# Patient Record
Sex: Male | Born: 1970 | Race: Black or African American | Hispanic: No | Marital: Single | Smoking: Former smoker
Health system: Southern US, Community
[De-identification: ages and names within clinical notes are randomized; demographics above are authoritative.]

## PROBLEM LIST (undated history)

## (undated) HISTORY — PX: HAND SURGERY: SHX662

---

## 1998-10-04 ENCOUNTER — Emergency Department (HOSPITAL_COMMUNITY): Admission: EM | Admit: 1998-10-04 | Discharge: 1998-10-04 | Payer: Self-pay | Admitting: Emergency Medicine

## 1998-10-26 ENCOUNTER — Emergency Department (HOSPITAL_COMMUNITY): Admission: EM | Admit: 1998-10-26 | Discharge: 1998-10-26 | Payer: Self-pay | Admitting: Emergency Medicine

## 2001-03-09 ENCOUNTER — Emergency Department (HOSPITAL_COMMUNITY): Admission: EM | Admit: 2001-03-09 | Discharge: 2001-03-09 | Payer: Self-pay | Admitting: Emergency Medicine

## 2001-12-26 ENCOUNTER — Encounter: Payer: Self-pay | Admitting: Emergency Medicine

## 2001-12-26 ENCOUNTER — Emergency Department (HOSPITAL_COMMUNITY): Admission: EM | Admit: 2001-12-26 | Discharge: 2001-12-26 | Payer: Self-pay | Admitting: Emergency Medicine

## 2004-05-12 ENCOUNTER — Ambulatory Visit: Payer: Self-pay | Admitting: Internal Medicine

## 2004-05-13 ENCOUNTER — Ambulatory Visit: Payer: Self-pay | Admitting: *Deleted

## 2007-06-12 ENCOUNTER — Emergency Department (HOSPITAL_COMMUNITY): Admission: EM | Admit: 2007-06-12 | Discharge: 2007-06-12 | Payer: Self-pay | Admitting: Emergency Medicine

## 2008-03-11 ENCOUNTER — Emergency Department (HOSPITAL_COMMUNITY): Admission: EM | Admit: 2008-03-11 | Discharge: 2008-03-11 | Payer: Self-pay | Admitting: Family Medicine

## 2008-03-14 ENCOUNTER — Encounter (HOSPITAL_COMMUNITY): Admission: RE | Admit: 2008-03-14 | Discharge: 2008-06-12 | Payer: Self-pay | Admitting: Family Medicine

## 2008-12-10 ENCOUNTER — Emergency Department (HOSPITAL_COMMUNITY): Admission: EM | Admit: 2008-12-10 | Discharge: 2008-12-10 | Payer: Self-pay | Admitting: Emergency Medicine

## 2010-04-08 LAB — POCT URINALYSIS DIP (DEVICE)
Glucose, UA: NEGATIVE mg/dL
Hgb urine dipstick: NEGATIVE
Nitrite: NEGATIVE
Protein, ur: 30 mg/dL — AB
Specific Gravity, Urine: 1.015 (ref 1.005–1.030)
Urobilinogen, UA: 2 mg/dL — ABNORMAL HIGH (ref 0.0–1.0)
pH: 7 (ref 5.0–8.0)

## 2010-08-16 ENCOUNTER — Observation Stay (HOSPITAL_COMMUNITY)
Admission: EM | Admit: 2010-08-16 | Discharge: 2010-08-17 | Disposition: A | Discharge status: Home / Self Care | Payer: Self-pay | Attending: Orthopedic Surgery | Admitting: Orthopedic Surgery

## 2010-08-16 ENCOUNTER — Emergency Department (HOSPITAL_COMMUNITY): Payer: Self-pay

## 2010-08-16 DIAGNOSIS — S62319A Displaced fracture of base of unspecified metacarpal bone, initial encounter for closed fracture: Principal | ICD-10-CM | POA: Insufficient documentation

## 2010-08-16 DIAGNOSIS — S63056A Dislocation of other carpometacarpal joint of unspecified hand, initial encounter: Secondary | ICD-10-CM | POA: Insufficient documentation

## 2010-08-16 DIAGNOSIS — Z01810 Encounter for preprocedural cardiovascular examination: Secondary | ICD-10-CM | POA: Insufficient documentation

## 2010-08-16 LAB — CBC
HCT: 37.5 % — ABNORMAL LOW (ref 39.0–52.0)
Hemoglobin: 13.5 g/dL (ref 13.0–17.0)
MCH: 33.1 pg (ref 26.0–34.0)
MCHC: 36 g/dL (ref 30.0–36.0)
MCV: 91.9 fL (ref 78.0–100.0)
Platelets: 243 10*3/uL (ref 150–400)
RBC: 4.08 MIL/uL — ABNORMAL LOW (ref 4.22–5.81)
RDW: 14 % (ref 11.5–15.5)
WBC: 8.7 10*3/uL (ref 4.0–10.5)

## 2010-08-16 LAB — DIFFERENTIAL
Basophils Absolute: 0 10*3/uL (ref 0.0–0.1)
Basophils Relative: 0 % (ref 0–1)
Eosinophils Absolute: 0.2 10*3/uL (ref 0.0–0.7)
Eosinophils Relative: 2 % (ref 0–5)
Lymphocytes Relative: 41 % (ref 12–46)
Lymphs Abs: 3.6 10*3/uL (ref 0.7–4.0)
Monocytes Absolute: 0.5 10*3/uL (ref 0.1–1.0)
Monocytes Relative: 6 % (ref 3–12)
Neutro Abs: 4.5 10*3/uL (ref 1.7–7.7)
Neutrophils Relative %: 51 % (ref 43–77)

## 2010-08-16 LAB — BASIC METABOLIC PANEL
BUN: 10 mg/dL (ref 6–23)
CO2: 26 mEq/L (ref 19–32)
Calcium: 9.6 mg/dL (ref 8.4–10.5)
Chloride: 102 mEq/L (ref 96–112)
Creatinine, Ser: 0.92 mg/dL (ref 0.50–1.35)
Glucose, Bld: 99 mg/dL (ref 70–99)
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
## 2012-01-26 ENCOUNTER — Encounter (HOSPITAL_COMMUNITY): Payer: Self-pay | Admitting: *Deleted

## 2012-01-26 ENCOUNTER — Emergency Department (INDEPENDENT_AMBULATORY_CARE_PROVIDER_SITE_OTHER): Payer: Self-pay

## 2012-01-26 ENCOUNTER — Emergency Department (INDEPENDENT_AMBULATORY_CARE_PROVIDER_SITE_OTHER)
Admission: EM | Admit: 2012-01-26 | Discharge: 2012-01-26 | Disposition: A | Discharge status: Home / Self Care | Payer: Self-pay | Source: Home / Self Care | Attending: Emergency Medicine | Admitting: Emergency Medicine

## 2012-01-26 DIAGNOSIS — S90129A Contusion of unspecified lesser toe(s) without damage to nail, initial encounter: Secondary | ICD-10-CM

## 2012-01-26 MED ORDER — MELOXICAM 7.5 MG PO TABS: 7.5000 mg | ORAL_TABLET | Freq: Every day | ORAL | Status: DC

## 2012-01-26 NOTE — ED Provider Notes (Signed)
History     CSN: [id number]  Arrival date & time 01/26/12  1429   First MD Initiated Contact with Patient 01/26/12 1441      Chief Complaint  Patient presents with  . Foot Pain    (Consider location/radiation/quality/duration/timing/severity/associated sxs/prior treatment) HPI Comments: Patient presents urgent care this afternoon complaining of right great toe pain and swelling for the last 5 days. Patient was walking when he asked likely headache his right great toe against the furniture.  He feels his great toe bended backwards he actually "heard a pop" . Swollen tender with movement and touch since then. ( patient points to the first metatarsal joint), no numbness, no weakness.  Patient is a 42 y.o. male presenting with lower extremity pain. The history is provided by the patient.  Foot Pain This is a new problem. The current episode started more than 2 days ago. The problem occurs constantly. The problem has not changed since onset.The symptoms are aggravated by twisting, bending and walking. The symptoms are relieved by rest. The treatment provided no relief.    History reviewed. No pertinent past medical history.  History reviewed. No pertinent past surgical history.  Family History  Problem Relation Age of Onset  . Family history unknown: Yes    History  Substance Use Topics  . Smoking status: Current Every Day Smoker    Types: Cigarettes  . Smokeless tobacco: Not on file  . Alcohol Use: Yes      Review of Systems  Constitutional: Negative for activity change and appetite change.  Musculoskeletal: Positive for joint swelling. Negative for myalgias, back pain, arthralgias and gait problem.  Skin: Negative for color change, pallor, rash and wound.  Neurological: Negative for weakness and numbness.    Allergies  Review of patient's allergies indicates not on file.  Home Medications   Current Outpatient Rx  Name  Route  Sig  Dispense  Refill  . MELOXICAM 7.5  MG PO TABS   Oral   Take 1 tablet (7.5 mg total) by mouth daily. Take one tablet daily for 2 weeks   14 tablet   0     BP 151/80  Pulse 92  Temp 98.3 F (36.8 C) (Oral)  Resp 16  Ht 6\' 1"  (1.854 m)  Wt 178 lb (80.74 kg)  BMI 23.48 kg/m2  SpO2 99%  Physical Exam  Nursing note and vitals reviewed. Constitutional: Vital signs are normal. He appears well-developed and well-nourished.  Musculoskeletal: He exhibits tenderness. He exhibits no edema.       Right foot: He exhibits decreased range of motion, tenderness, bony tenderness and swelling. He exhibits normal capillary refill, no crepitus, no deformity and no laceration.       Feet:  Neurological: He is alert. He exhibits normal muscle tone. Coordination normal.  Skin: No rash noted. No erythema.    ED Course  Procedures (including critical care time)  Labs Reviewed - No data to display Dg Foot Complete Right  01/26/2012  *RADIOLOGY REPORT*  Clinical Data: Pain post trauma  RIGHT FOOT COMPLETE - 3+ VIEW  Comparison: None.  Findings:  Frontal, oblique, and lateral views were obtained. There is no fracture or dislocation.  Joint spaces appear intact. No erosive change.  No radiopaque foreign body.  IMPRESSION: No abnormality noted.   Original Report Authenticated By: Bretta Bang, M.D.      1. Contusion, toe       MDM  Right great toe contusion. X-rays were negative for fractures  or dislocations or subluxations. Patient was put in a postop shoe versus take a anti-inflammatory cycle with meloxicam for 10-14 days. Encouraged to follow-up with the [facility] her or an orthopedic provider if pain was to persist beyond 10-14 days       Jimmie Molly, MD 01/26/12 1607

## 2012-01-26 NOTE — ED Notes (Signed)
Pt reports right foot pain for the past 5 days swelling and bruising present to great toe

## 2012-07-13 ENCOUNTER — Emergency Department (INDEPENDENT_AMBULATORY_CARE_PROVIDER_SITE_OTHER)
Admission: EM | Admit: 2012-07-13 | Discharge: 2012-07-13 | Disposition: A | Discharge status: Home / Self Care | Payer: Self-pay | Source: Home / Self Care | Attending: Family Medicine | Admitting: Family Medicine

## 2012-07-13 ENCOUNTER — Encounter (HOSPITAL_COMMUNITY): Payer: Self-pay | Admitting: Emergency Medicine

## 2012-07-13 ENCOUNTER — Emergency Department (INDEPENDENT_AMBULATORY_CARE_PROVIDER_SITE_OTHER): Payer: Self-pay

## 2012-07-13 DIAGNOSIS — S9030XA Contusion of unspecified foot, initial encounter: Secondary | ICD-10-CM

## 2012-07-13 DIAGNOSIS — S9031XA Contusion of right foot, initial encounter: Secondary | ICD-10-CM

## 2012-07-13 MED ORDER — IBUPROFEN 800 MG PO TABS: 800.0000 mg | ORAL_TABLET | Freq: Three times a day (TID) | ORAL | Status: DC
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
## 2012-07-13 NOTE — ED Provider Notes (Signed)
Medical screening examination/treatment/procedure(s) were performed by resident physician or non-physician practitioner and as supervising physician I was immediately available for consultation/collaboration.   Trannie Bardales DOUGLAS MD.   Santiago Graf D Wynn Kernes, MD 07/13/12 2057 

## 2014-12-01 ENCOUNTER — Encounter (HOSPITAL_COMMUNITY): Payer: Self-pay | Admitting: *Deleted

## 2014-12-01 ENCOUNTER — Emergency Department (HOSPITAL_COMMUNITY)
Admission: EM | Admit: 2014-12-01 | Discharge: 2014-12-01 | Disposition: A | Discharge status: Home / Self Care | Payer: Self-pay | Attending: Emergency Medicine | Admitting: Emergency Medicine

## 2014-12-01 DIAGNOSIS — R61 Generalized hyperhidrosis: Secondary | ICD-10-CM | POA: Insufficient documentation

## 2014-12-01 DIAGNOSIS — J36 Peritonsillar abscess: Secondary | ICD-10-CM | POA: Insufficient documentation

## 2014-12-01 DIAGNOSIS — F17210 Nicotine dependence, cigarettes, uncomplicated: Secondary | ICD-10-CM | POA: Insufficient documentation

## 2014-12-01 LAB — RAPID STREP SCREEN (MED CTR MEBANE ONLY): STREPTOCOCCUS, GROUP A SCREEN (DIRECT): NEGATIVE

## 2014-12-01 MED ORDER — CLINDAMYCIN HCL 300 MG PO CAPS: 300.0000 mg | ORAL_CAPSULE | Freq: Four times a day (QID) | ORAL | Status: DC

## 2014-12-01 MED ORDER — CLINDAMYCIN PHOSPHATE 900 MG/50ML IV SOLN
900.0000 mg | Freq: Once | INTRAVENOUS | Status: AC
  Administered 2014-12-01: 900 mg via INTRAVENOUS
  Filled 2014-12-01: qty 50

## 2014-12-01 MED ORDER — DEXAMETHASONE SODIUM PHOSPHATE 10 MG/ML IJ SOLN
20.0000 mg | Freq: Once | INTRAMUSCULAR | Status: AC
  Administered 2014-12-01: 20 mg via INTRAVENOUS
  Filled 2014-12-01: qty 2

## 2014-12-01 NOTE — Discharge Instructions (Signed)
You were seen in the ED today for your sore throat. You were diagnosed with a peritonsillar abscess. He received antibiotics in the ED today as well as steroids. Please continue taking her antibiotics at home. Follow-up with Dr. Suszanne Connerseoh on Monday at his office.  Peritonsillar Abscess A peritonsillar abscess is a collection of yellowish-white fluid (pus) in the back of the throat behind the tonsils. It usually occurs when an infection of the throat or tonsils (tonsillitis) spreads into the tissues around the tonsils. CAUSES The infection that leads to a peritonsillar abscess is usually caused by streptococcal bacteria.  SIGNS AND SYMPTOMS  Sore throat, often with pain on just one side.  Swelling and tenderness of the glands (lymph nodes) in the neck.  Difficulty swallowing.  Difficulty opening your mouth.  Fever.  Chills.  Drooling because of difficulty swallowing saliva.  Headache.  Changes in your voice.  Bad breath. DIAGNOSIS Your health care provider will take your medical history and do a physical exam. Imaging tests may be done, such as an ultrasound or CT scan. A sample of pus may be removed from the abscess using a needle (needle aspiration) or by swabbing the back of your throat. This sample will be sent to a lab for testing. TREATMENT Treatment usually involves draining the pus from the abscess. This may be done through needle aspiration or by making an incision in the abscess. You will also likely need to take antibiotic medicine. HOME CARE INSTRUCTIONS  Rest as much as possible and get plenty of sleep.  Take medicines only as directed by your health care provider.  If you were prescribed an antibiotic medicine, finish it all even if you start to feel better.  If your abscess was drained by your health care provider, gargle with a mixture of salt and warm water:  Mix 1 tsp of salt in 8 oz of warm water.  Gargle with this mixture four times per day or as needed for  comfort.  Do not swallow this mixture.  Drink plenty of fluids.  While your throat is sore, eat soft or liquid foods, such as frozen ice pops and ice cream.  Keep all follow-up visits as directed by your health care provider. This is important. SEEK MEDICAL CARE IF:  You have increased pain, swelling, redness, or drainage in your throat.  You develop a headache, a lack of energy (lethargy), or generalized feelings of illness.  You have a fever.  You feel dizzy.  You have difficulty swallowing or eating.  You show signs of becoming dehydrated, such as:  Light-headedness when standing.  Decreased urine output.  A fast heart rate.  Dry mouth. SEEK IMMEDIATE MEDICAL CARE IF:   You have difficulty talking or breathing, or you find it easier to breathe when you lean forward.  You are coughing up blood or vomiting blood.  You have severe throat pain that is not helped by medicines.  You start to drool.   This information is not intended to replace advice given to you by your health care provider. Make sure you discuss any questions you have with your health care provider.   Document Released: 12/22/2004 Document Revised: 01/12/2014 Document Reviewed: 08/07/2013 Elsevier Interactive Patient Education [employer]vier Inc.

## 2014-12-01 NOTE — ED Provider Notes (Signed)
CSN: [id number]     Arrival date & time 12/01/14  4098 History  By signing my name below, I, Soijett Blue, attest that this documentation has been prepared under the direction and in the presence of Tyler Peek, [employer] Electronically Signed: Soijett Blue, ED Scribe. 12/01/2014. 10:13 AM.   Chief Complaint  Patient presents with  . Sore Throat      The history is provided by the patient. No language interpreter was used.    [name] is a 44 y.o. male who presents to the Emergency Department complaining of intermittent sore throat with painful swallowing x 1 week. He notes that the last time he had these symptoms he was dx and treated with abx. He states that he is having associated symptoms of subjective fever, diaphoresis, HA, sinus pressure. He states that he has tried theraflu with no relief for his symptoms. He denies cough, n/v, and any other symptoms. Denies sick contacts. Pt smokes cigarettes daily.    History reviewed. No pertinent past medical history. History reviewed. No pertinent past surgical history. History reviewed. No pertinent family history. Social History  Substance Use Topics  . Smoking status: Current Every Day Smoker    Types: Cigarettes  . Smokeless tobacco: None  . Alcohol Use: Yes    Review of Systems  Constitutional: Positive for fever and diaphoresis.  HENT: Positive for sinus pressure and sore throat.   Respiratory: Negative for cough.   Gastrointestinal: Negative for nausea and vomiting.  Neurological: Positive for headaches.      Allergies  Review of patient's allergies indicates no known allergies.  Home Medications   Prior to Admission medications   Medication Sig Start Date End Date Taking? Authorizing Provider  clindamycin (CLEOCIN) 300 MG capsule Take 1 capsule (300 mg total) by mouth 4 (four) times daily. X 7 days 12/01/14   Tyler Peek, PA-C   BP 119/74 mmHg  Pulse 63  Temp(Src) 97.5 F (36.4 C) (Oral)  Resp 20   SpO2 99% Physical Exam  Constitutional: He is oriented to person, place, and time. He appears well-developed and well-nourished. No distress.  HENT:  Head: Normocephalic and atraumatic.  Mouth/Throat: No trismus in the jaw. Oropharyngeal exudate and posterior oropharyngeal erythema present.  No trismus. No glossal elevation. No submandible tenderness. Right-sided tonsillar exudate. Right tonsil more enlarged. Possible developing peritonsillar abscess. Mallampati 1  Eyes: EOM are normal.  Neck: Neck supple.  Cardiovascular: Normal rate, regular rhythm and normal heart sounds.  Exam reveals no gallop and no friction rub.   No murmur heard. Pulmonary/Chest: Effort normal and breath sounds normal. No respiratory distress. He has no wheezes. He has no rales.  Abdominal: Soft. There is no tenderness.  Musculoskeletal: Normal range of motion.  Lymphadenopathy:       Head (right side): No occipital adenopathy present.       Head (left side): No occipital adenopathy present.    He has no cervical adenopathy.  Neurological: He is alert and oriented to person, place, and time.  Skin: Skin is warm and dry.  Psychiatric: He has a normal mood and affect. His behavior is normal.  Nursing note and vitals reviewed.   ED Course  Procedures (including critical care time) DIAGNOSTIC STUDIES: Oxygen Saturation is 98% on RA, nl by my interpretation.    COORDINATION OF CARE: 10:13 AM Discussed treatment plan with pt at bedside which includes rapid strep screen and culture, abx Rx, and f/u PRN and pt agreed to plan.  10:40  AM- consult with Dr. Suszanne Connerseoh, ENT specialist, who recommends that the pt receive 900 mg IV clindamycin, 20 mg decadron, and oral clindamycin Rx and f/u in office on Monday, 12/03/14 for further evaluation.  Labs Review Labs Reviewed  [facility]APID STRE[facility]EN ([facility])  CULTURE, GROUP A STREP    Imaging Review No results found. I have personally reviewed and evaluated these lab results  as part of my medical decision-making.   EKG Interpretation None     Meds given in ED:  Medications  clindamycin (CLEOCIN) IVPB 900 mg (0 mg Intravenous Stopped 12/01/14 1144)  dexamethasone (DECADRON) injection 20 mg (20 mg Intravenous Given 12/01/14 1105)    Discharge Medication List as of 12/01/2014 10:57 AM    START taking these medications   Details  clindamycin (CLEOCIN) 300 MG capsule Take 1 capsule (300 mg total) by mouth 4 (four) times daily. X 7 days, Starting 12/01/2014, Until Discontinued, Print       Filed Vitals:   12/01/14 0958 12/01/14 1115 12/01/14 1145  BP: 134/93 122/74 119/74  Pulse: 67 54 63  Temp: 97.5 F (36.4 C)    TempSrc: Oral    Resp: 20    SpO2: 98% 100% 99%    MDM  [name] is a 44 y.o. male patient presents with sore throat. On arrival he is hemodynamically stable and afebrile. On exam he does have an enlarged right tonsil without trismus or glossal elevation. No drooling or "hot potato" voice. However, there does appear to be an evolving PTA. We'll consult ENT to ensure follow-up. Discussed with Dr.Teoh, recommends 900 milligrams IV clindamycin along with 20 mg IV Decadron, DC with oral clindamycin and he will follow up with patient in the office on Monday. Patient overall appears well, nontoxic, hemodynamically stable with normal vital signs and is appropriate for discharge to follow-up outpatient. He agrees with plan and verbalizes no other questions or concerns. Final diagnoses:  Peritonsillar abscess   I personally performed the services described in this documentation, which was scribed in my presence. The recorded information has been reviewed and is accurate.    Tyler PeekBenjamin Yeng Perz, PA-C 12/01/14 1424  Pricilla LovelessScott Goldston, MD 12/01/14 1640

## 2014-12-01 NOTE — ED Notes (Signed)
Pt states he has had a sore throat x1 week "off and on". Pt denies cough, states he has had a fever but hasn't taken his temp. Endorses chills and ha.

## 2014-12-03 LAB — CULTURE, GROUP A STREP
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
## 2015-12-06 ENCOUNTER — Emergency Department (HOSPITAL_COMMUNITY)
Admission: EM | Admit: 2015-12-06 | Discharge: 2015-12-06 | Disposition: A | Discharge status: Home / Self Care | Payer: Self-pay | Attending: Emergency Medicine | Admitting: Emergency Medicine

## 2015-12-06 ENCOUNTER — Encounter (HOSPITAL_COMMUNITY): Payer: Self-pay | Admitting: Emergency Medicine

## 2015-12-06 DIAGNOSIS — F17210 Nicotine dependence, cigarettes, uncomplicated: Secondary | ICD-10-CM | POA: Insufficient documentation

## 2015-12-06 DIAGNOSIS — J039 Acute tonsillitis, unspecified: Secondary | ICD-10-CM | POA: Insufficient documentation

## 2015-12-06 LAB — RAPID STREP SCREEN (MED CTR MEBANE ONLY): Streptococcus, Group A Screen (Direct): NEGATIVE

## 2015-12-06 MED ORDER — AMOXICILLIN 500 MG PO CAPS
500.0000 mg | ORAL_CAPSULE | Freq: Three times a day (TID) | ORAL | 0 refills | Status: DC
Start: 2015-12-06 — End: 2022-06-16

## 2015-12-06 NOTE — ED Provider Notes (Signed)
[facility] Provider Note   CSN: [id number] Arrival date & time: 12/06/15  2034  By signing my name below, I, Doreatha MartinEva Mathews, attest that this documentation has been prepared under the direction and in the presence of  [facility]. Damian LeavellNeese, NP. Electronically Signed: Doreatha MartinEva Mathews, ED Scribe. 12/06/15. 9:04 PM.    History   Chief Complaint Chief Complaint  Patient presents with  . Sore Throat    HPI [name] is a 45 y.o. male otherwise healthy who presents to the Emergency Department complaining of moderate sore throat onset a week ago with associated fever, chills, HA, cough. Pt states that pain is worsened with swallowing. Pt denies taking OTC medications at home to improve symptoms. Pt reports recent sick contact with mother, who has a cough. Pt is a current smoker, 1ppd since age 45. He denies chest congestion, sinus pain or pressure, nausea, vomiting, diarrhea, ear pain, difficulty tolerating secretions or swallowing.    The history is provided by the patient. No language interpreter was used.  Sore Throat  This is a new problem. The current episode started more than 1 week ago. The problem occurs constantly. The problem has been gradually worsening. Associated symptoms include headaches. The symptoms are aggravated by swallowing. Nothing relieves the symptoms. He has tried nothing for the symptoms. The treatment provided no relief.    History reviewed. No pertinent past medical history.  There are no active problems to display for this patient.   History reviewed. No pertinent surgical history.     Home Medications    Prior to Admission medications   Medication Sig Start Date End Date Taking? Authorizing Provider  amoxicillin (AMOXIL) 500 MG capsule Take 1 capsule (500 mg total) by mouth 3 (three) times daily. 12/06/15   Hope Orlene OchM Neese, NP  clindamycin (CLEOCIN) 300 MG capsule Take 1 capsule (300 mg total) by mouth 4 (four) times daily. X 7 days 12/01/14   Joycie PeekBenjamin  Cartner, PA-C    Family History No family history on file.  Social History Social History  Substance Use Topics  . Smoking status: Current Every Day Smoker    Types: Cigarettes  . Smokeless tobacco: Not on file  . Alcohol use Yes     Allergies   Patient has no known allergies.   Review of Systems Review of Systems  Constitutional: Positive for chills and fever.  HENT: Positive for sore throat. Negative for congestion, ear pain, sinus pain, sinus pressure and trouble swallowing.   Respiratory: Positive for cough.   Gastrointestinal: Negative for diarrhea, nausea and vomiting.  Neurological: Positive for headaches.  All other systems reviewed and are negative.    Physical Exam Updated Vital Signs BP 136/84   Pulse 90   Temp 98.5 F (36.9 C) (Oral)   Resp 16   SpO2 100%   Physical Exam  Constitutional: He appears well-developed and well-nourished. No distress.  HENT:  Head: Normocephalic.  Mouth/Throat: Uvula is midline and mucous membranes are normal. Posterior oropharyngeal erythema present. No posterior oropharyngeal edema or tonsillar abscesses. Tonsils are 2+ on the right. Tonsils are 2+ on the left. Tonsillar exudate.  TMs normal bilaterally. Uvula midline. No edema. Positive erythema. There is tonsillar exudate.   Eyes: Conjunctivae and EOM are normal. Pupils are equal, round, and reactive to light. No scleral icterus.  Neck: Normal range of motion. Neck supple.  Cardiovascular: Normal rate and regular rhythm.   Pulmonary/Chest: Effort normal and breath sounds normal. No respiratory distress. He has no  wheezes. He has no rales.  Abdominal: Soft. Bowel sounds are normal. He exhibits no distension. There is no tenderness.  Musculoskeletal: Normal range of motion.  Lymphadenopathy:    He has cervical adenopathy (slightly enlarged cervical lymph nodes bilaterally).  Neurological: He is alert.  Skin: Skin is warm and dry.  Psychiatric: He has a normal mood and  affect. His behavior is normal.  Nursing note and vitals reviewed.    ED Treatments / Results   DIAGNOSTIC STUDIES: Oxygen Saturation is 100% on RA, normal by my interpretation.    COORDINATION OF CARE: 8:57 PM Discussed treatment plan with pt at bedside which includes rapid strep and pt agreed to plan.    Labs (all labs ordered are listed, but only abnormal results are displayed) Labs Reviewed  RAPID STREP SCREEN (NOT AT [facility])  CULTURE, GROUP A STREP [facility])   Radiology No results found.  Procedures Procedures (including critical care time)  Medications Ordered in ED Medications - No data to display   Initial Impression / Assessment and Plan / ED Course  I have reviewed the triage vital signs and the nursing notes.  Pertinent lab results that were available during my care of the patient were reviewed by me and considered in my medical decision making (see chart for details).  Clinical Course     Pt with negative strep. Diagnosis of tonsillitis with exudate. Will treat with Amoxicillin. Discussed that results of strep culture are pending and patient will be informed if positive result. Discharge with symptomatic tx. No evidence of dehydration. Pt is tolerating secretions. Presentation not concerning for peritonsillar abscess or spread of infection to deep spaces of the throat; patent airway. Specific return precautions discussed. Recommended PCP follow up. Pt appears safe for discharge.     Final Clinical Impressions(s) / ED Diagnoses   Final diagnoses:  Tonsillitis with exudate    New Prescriptions New Prescriptions   AMOXICILLIN (AMOXIL) 500 MG CAPSULE    Take 1 capsule (500 mg total) by mouth 3 (three) times daily.   I personally performed the services described in this documentation, which was scribed in my presence. The recorded information has been reviewed and is accurate.    CairoHope M Neese, [place]NP 12/06/15 2135    Lorre NickAnthony Allen, MD 12/06/15 [phone]

## 2015-12-06 NOTE — ED Notes (Signed)
Patient is A&Ox4 at this time.  Patient in no signs of distress.  Please see providers note for complete history and physical exam.  

## 2015-12-06 NOTE — Discharge Instructions (Signed)
Take tylenol or ibuprofen as needed for pain and fever. Call and [facility] so you will have a doctor for your general health care and can follow up. If you symptoms worsen return here.

## 2015-12-06 NOTE — ED Triage Notes (Signed)
Pt c/o sore throat x1 week and cough.

## 2015-12-06 NOTE — ED Notes (Signed)
Patient Alert and oriented X4. Stable and ambulatory. Patient verbalized understanding of the discharge instructions.  Patient belongings were taken by the patient.  

## 2015-12-09 LAB — CULTURE, GROUP A STREP (THRC)

## 2016-10-28 ENCOUNTER — Emergency Department (HOSPITAL_COMMUNITY)
Admission: EM | Admit: 2016-10-28 | Discharge: 2016-10-28 | Disposition: A | Discharge status: Home / Self Care | Payer: Self-pay | Attending: Emergency Medicine | Admitting: Emergency Medicine

## 2016-10-28 ENCOUNTER — Encounter (HOSPITAL_COMMUNITY): Payer: Self-pay | Admitting: *Deleted

## 2016-10-28 DIAGNOSIS — Z79899 Other long term (current) drug therapy: Secondary | ICD-10-CM | POA: Insufficient documentation

## 2016-10-28 DIAGNOSIS — F17210 Nicotine dependence, cigarettes, uncomplicated: Secondary | ICD-10-CM | POA: Insufficient documentation

## 2016-10-28 DIAGNOSIS — J0100 Acute maxillary sinusitis, unspecified: Secondary | ICD-10-CM | POA: Insufficient documentation

## 2016-10-28 MED ORDER — DEXAMETHASONE SODIUM PHOSPHATE 10 MG/ML IJ SOLN
10.0000 mg | Freq: Once | INTRAMUSCULAR | Status: AC
  Administered 2016-10-28: 10 mg via INTRAMUSCULAR
  Filled 2016-10-28: qty 1

## 2016-10-28 MED ORDER — FLUTICASONE PROPIONATE 50 MCG/ACT NA SUSP: 1.0000 | Freq: Every day | NASAL | 0 refills | Status: DC

## 2016-10-28 NOTE — ED Triage Notes (Signed)
Pt reports sinus congestion and drainage for several days. Reports green nasal drainage and fatigue. No acute distress is noted at triage.

## 2016-10-28 NOTE — ED Notes (Signed)
Pt denies any resp problems or cough.  St's he feels like there is pressure in face.

## 2016-10-28 NOTE — ED Provider Notes (Signed)
[facility] Provider Note   CSN: [id number] Arrival date & time: 10/28/16  1703     History   Chief Complaint Chief Complaint  Patient presents with  . URI    HPI [name] is a 46 y.o. male presenting with 4-day history of nasal congestion and sinus pressure.  Patient states that for the past 4 days, he has had persistent nasal congestion, sinus pressure, and postnasal drip.  Symptoms have been persistent, and today, he reports feeling more tired than normal.  He has not been taking anything for his symptoms.  Nothing makes it better, nothing makes it worse.  He reports green mucus coming from his nose.  He denies ear pain or eye pain/drainage.  He denies sore throat, chest pain, shortness of breath, nausea, vomiting, abdominal pain.  He denies fevers or chills.  He denies sick contacts.  He has no other medical problems, does not take medications daily.  HPI  History reviewed. No pertinent past medical history.  There are no active problems to display for this patient.   History reviewed. No pertinent surgical history.     Home Medications    Prior to Admission medications   Medication Sig Start Date End Date Taking? Authorizing Provider  amoxicillin (AMOXIL) 500 MG capsule Take 1 capsule (500 mg total) by mouth 3 (three) times daily. 12/06/15   Janne NapoleonNeese, Hope M, NP  clindamycin (CLEOCIN) 300 MG capsule Take 1 capsule (300 mg total) by mouth 4 (four) times daily. X 7 days 12/01/14   Joycie Peekartner, Benjamin, PA-C  fluticasone [facility](FLONASE) 50 MCG/ACT nasal spray Place 1 spray into both nostrils daily. 10/28/16   Gordie Crumby, PA-C    Family History History reviewed. No pertinent family history.  Social History Social History  Substance Use Topics  . Smoking status: Current Every Day Smoker    Types: Cigarettes  . Smokeless tobacco: Not on file  . Alcohol use Yes     Allergies   Patient has no known allergies.   Review of  Systems Review of Systems  Constitutional: Negative for chills and fever.  HENT: Positive for congestion, postnasal drip, rhinorrhea, sinus pain and sinus pressure.      Physical Exam Updated Vital Signs BP 140/85 (BP Location: Right Arm)   Pulse 72   Temp 98.6 F (37 C) (Oral)   Resp 16   SpO2 99%   Physical Exam  Constitutional: He is oriented to person, place, and time. He appears well-developed and well-nourished. No distress.  HENT:  Head: Normocephalic and atraumatic.  Right Ear: Tympanic membrane, external ear and ear canal normal.  Left Ear: Tympanic membrane, external ear and ear canal normal.  Nose: Mucosal edema present. Right sinus exhibits maxillary sinus tenderness. Right sinus exhibits no frontal sinus tenderness. Left sinus exhibits maxillary sinus tenderness. Left sinus exhibits no frontal sinus tenderness.  Mouth/Throat: Uvula is midline, oropharynx is clear and moist and mucous membranes are normal. No tonsillar exudate.  Nasal mucosa inflamed, left worse than right.  Tenderness to palpation maxillary sinuses, left worse than right.  Eyes: Pupils are equal, round, and reactive to light. Conjunctivae and EOM are normal.  Neck: Normal range of motion.  Cardiovascular: Normal rate, regular rhythm and intact distal pulses.   Pulmonary/Chest: Effort normal and breath sounds normal. No respiratory distress. He has no wheezes. He has no rales. He exhibits no tenderness.  Speaking in full sentences without difficulty.  Abdominal: Soft. There is no tenderness.  Musculoskeletal: Normal range of motion.  Lymphadenopathy:    He has no cervical adenopathy.  Neurological: He is alert and oriented to person, place, and time.  Skin: Skin is warm and dry.  Psychiatric: He has a normal mood and affect.  Nursing note and vitals reviewed.    ED Treatments / Results  Labs (all labs ordered are listed, but only abnormal results are displayed) Labs Reviewed - No data to  display  EKG  EKG Interpretation None       Radiology No results found.  Procedures Procedures (including critical care time)  Medications Ordered in ED Medications  dexamethasone (DECADRON) injection 10 mg (10 mg Intramuscular Given 10/28/16 2138)     Initial Impression / Assessment and Plan / ED Course  I have reviewed the triage vital signs and the nursing notes.  Pertinent labs & imaging results that were available during my care of the patient were reviewed by me and considered in my medical decision making (see chart for details).     Patient presenting with 4-day history of sinus pressure and nasal congestion.  Physical exam shows inflamed nasal mucosa and maxillary sinus pressure.  He is afebrile not tachycardic, lungs exam reassuring.  Doubt bacterial infection at this time, likely viral, as symptoms have been going on for 4 days.  Will give Decadron shot, and have patient use Flonase for symptom control.  Patient to follow-up with [place] [facility] as needed.  At this time, patient appears safe for discharge.  Return precautions given.  Patient states he understands and agrees to plan.   Final Clinical Impressions(s) / ED Diagnoses   Final diagnoses:  Acute maxillary sinusitis, recurrence not specified    New Prescriptions Discharge Medication List as of 10/28/2016  8:33 PM    START taking these medications   Details  fluticasone (FLONASE) 50 MCG/ACT nasal spray Place 1 spray into both nostrils daily., Starting Wed 10/28/2016, Print         [place], Remer, PA-C 10/29/16 0057    Lavera Guise, MD 10/29/16 [phone]

## 2016-10-28 NOTE — Discharge Instructions (Signed)
Use Flonase daily to help with nasal congestion. Make sure you stay well-hydrated with water. It may help with your congestion if you try to breathing through your nose in a steamy hot shower. Make sure to wash your hands frequently to prevent spread of illness. Follow-up with [place] [facility] in 1 week if your symptoms are not improving. Return to the emergency room if you develop persistent high fevers, difficulty breathing, or any new or worsening symptoms.

## 2022-06-16 ENCOUNTER — Other Ambulatory Visit: Payer: Self-pay

## 2022-06-16 ENCOUNTER — Encounter (HOSPITAL_COMMUNITY): Payer: Self-pay | Admitting: Emergency Medicine

## 2022-06-16 ENCOUNTER — Ambulatory Visit (HOSPITAL_COMMUNITY)
Admission: EM | Admit: 2022-06-16 | Discharge: 2022-06-16 | Disposition: A | Discharge status: Home / Self Care | Payer: BLUE CROSS/BLUE SHIELD | Attending: Physician Assistant | Admitting: Physician Assistant

## 2022-06-16 DIAGNOSIS — K0889 Other specified disorders of teeth and supporting structures: Secondary | ICD-10-CM | POA: Diagnosis not present

## 2022-06-16 DIAGNOSIS — K047 Periapical abscess without sinus: Secondary | ICD-10-CM | POA: Diagnosis not present

## 2022-06-16 DIAGNOSIS — H18892 Other specified disorders of cornea, left eye: Secondary | ICD-10-CM | POA: Diagnosis not present

## 2022-06-16 MED ORDER — FLUORESCEIN SODIUM 1 MG OP STRP
ORAL_STRIP | OPHTHALMIC | Status: AC
  Filled 2022-06-16: qty 1

## 2022-06-16 MED ORDER — TETRACAINE HCL 0.5 % OP SOLN
OPHTHALMIC | Status: AC
  Filled 2022-06-16: qty 4

## 2022-06-16 MED ORDER — ERYTHROMYCIN 5 MG/GM OP OINT: TOPICAL_OINTMENT | OPHTHALMIC | 0 refills | Status: DC

## 2022-06-16 MED ORDER — AMOXICILLIN-POT CLAVULANATE 875-125 MG PO TABS: 1.0000 | ORAL_TABLET | Freq: Two times a day (BID) | ORAL | 0 refills | Status: DC

## 2022-06-16 NOTE — ED Provider Notes (Signed)
[facility]    CSN: [id number] Arrival date & time: 06/16/22  1630      History   Chief Complaint Chief Complaint  Patient presents with   Dental Pain   Eye Pain    HPI [name] is a 52 y.o. male.   Patient presents today with several concerns.  His primary concern today is a dental abscess that began a few days ago.  He has had issues in the past and is in the process of arranging extraction with a dentist.  He reports that pain has gradually been worsening and is currently rated 6/7 on a 0-10 pain scale, described as throbbing, no aggravating or alleviating factors identified.  He has tried over-the-counter medications without improvement of symptoms.  Denies any recent antibiotic use.  He is able to eat and drink normally.  Denies any swelling with throat, shortness of breath, muffled voice, fever, nausea, vomiting.  In addition, patient reports that he has had irritation in his left eye for the past 6 days.  He was at work weed eating when a rock came up underneath his safety glasses and hit him in the eye.  He has been using eyewash but continues to have some irritation and a gritty sensation in the eye.  He denies any significant overnight drainage.  He does not wear contacts.  Denies any visual disturbance but has had some photophobia with increased tearing.  He does not have an ophthalmologist.    History reviewed. No pertinent past medical history.  There are no problems to display for this patient.   Past Surgical History:  Procedure Laterality Date   HAND SURGERY Left        Home Medications    Prior to Admission medications   Medication Sig Start Date End Date Taking? Authorizing Provider  amoxicillin-clavulanate (AUGMENTIN) 875-125 MG tablet Take 1 tablet by mouth every 12 (twelve) hours. 06/16/22  Yes Asa Fath K, PA-C  erythromycin ophthalmic ointment Place a 1/2 inch ribbon of ointment into the lower eyelid of left eye twice daily for 1  week 06/16/22  Yes Shaney Deckman, Noberto Retort, PA-C    Family History History reviewed. No pertinent family history.  Social History Social History   Tobacco Use   Smoking status: Former    Types: Cigarettes  Building services engineer Use: Never used  Substance Use Topics   Alcohol use: Yes   Drug use: Yes    Types: Marijuana     Allergies   Patient has no known allergies.   Review of Systems Review of Systems  Constitutional:  Negative for activity change, appetite change, fatigue and fever.  HENT:  Positive for dental problem and facial swelling. Negative for congestion, sinus pressure, sneezing, sore throat, trouble swallowing and voice change.   Eyes:  Positive for photophobia, discharge and redness. Negative for pain, itching and visual disturbance.  Respiratory:  Negative for cough and shortness of breath.   Cardiovascular:  Negative for chest pain.  Gastrointestinal:  Negative for abdominal pain, diarrhea, nausea and vomiting.  Neurological:  Negative for dizziness, light-headedness and headaches.     Physical Exam Triage Vital Signs ED Triage Vitals  Enc Vitals Group     BP 06/16/22 1655 (!) 145/83     Pulse Rate 06/16/22 1655 68     Resp 06/16/22 1655 18     Temp 06/16/22 1655 97.9 F (36.6 C)     Temp Source 06/16/22 1655 Oral  SpO2 06/16/22 1655 98 %     Weight --      Height --      Head Circumference --      Peak Flow --      Pain Score 06/16/22 1652 2     Pain Loc --      Pain Edu? --      Excl. in GC? --    No data found.  Updated Vital Signs BP (!) 145/83 (BP Location: Right Arm) Comment: large cuff  Pulse 68   Temp 97.9 F (36.6 C) (Oral)   Resp 18   SpO2 98%   Visual Acuity Right Eye Distance: 20/25 Left Eye Distance: 20/50 Bilateral Distance: 20/30  Right Eye Near:   Left Eye Near:    Bilateral Near:     Physical Exam Vitals reviewed.  Constitutional:      General: He is awake.     Appearance: Normal appearance. He is well-developed.  He is not ill-appearing.     Comments: Very pleasant male appears stated age in no acute distress sitting comfortably in exam room  HENT:     Head: Normocephalic and atraumatic.     Right Ear: External ear normal.     Left Ear: External ear normal.     Mouth/Throat:     Dentition: Abnormal dentition. Gingival swelling and dental abscesses present.     Pharynx: Uvula midline. No oropharyngeal exudate or posterior oropharyngeal erythema.      Comments: No evidence of Ludwig angina Eyes:     General: Lids are everted, no foreign bodies appreciated.        Right eye: No foreign body or discharge.        Left eye: No foreign body or discharge.     Extraocular Movements: Extraocular movements intact.     Conjunctiva/sclera:     Right eye: Right conjunctiva is not injected. No chemosis.    Left eye: Left conjunctiva is injected. No chemosis.    Pupils: Pupils are equal, round, and reactive to light.     Left eye: No corneal abrasion or fluorescein uptake. Seidel exam negative. Cardiovascular:     Rate and Rhythm: Normal rate and regular rhythm.     Heart sounds: Normal heart sounds, S1 normal and S2 normal. No murmur heard. Pulmonary:     Effort: Pulmonary effort is normal. No accessory muscle usage or respiratory distress.     Breath sounds: Normal breath sounds. No stridor. No wheezing, rhonchi or rales.     Comments: Clear to auscultation bilaterally Neurological:     Mental Status: He is alert.  Psychiatric:        Behavior: Behavior is cooperative.      UC Treatments / Results  Labs (all labs ordered are listed, but only abnormal results are displayed) Labs Reviewed - No data to display  EKG   Radiology No results found.  Procedures Procedures (including critical care time)  Medications Ordered in UC Medications - No data to display  Initial Impression / Assessment and Plan / UC Course  I have reviewed the triage vital signs and the nursing notes.  Pertinent labs  & imaging results that were available during my care of the patient were reviewed by me and considered in my medical decision making (see chart for details).     Patient is well-appearing, afebrile, nontoxic, nontachycardic.  No indication for emergent evaluation or imaging.  Patient was treated for dental infection with Augmentin twice daily for  10 days.  He can use Tylenol and ibuprofen for pain.  Recommend he gargle with warm salt water for additional symptom relief.  Discussed that ultimately he will need to see dentist to address underlying tooth. If he develops any worsening symptoms including fever, nausea, vomiting, swelling of his throat, muffled voice, dysphagia he needs to go to the emergency room to which he expressed understanding.  Work excuse note provided.  Fluorescein staining was normal with no evidence of abrasion and resolution of pain and photophobia with application of tetracaine.  Given persistent irritation will start erythromycin ointment twice daily for 7 days.  Recommended that he use lubricating eyedrops for additional symptom relief.  Discussed that if symptoms or not improving quickly he should follow-up with ophthalmology was given contact information for local provider with instruction to call to schedule appointment.  Discussed that if he has any visual disturbance, photophobia, ocular pain he should be reevaluated.  Strict return precautions given.  Final Clinical Impressions(s) / UC Diagnoses   Final diagnoses:  Dental abscess  Pain, dental  Corneal irritation of left eye     Discharge Instructions      Start Augmentin twice daily for 7 days.  You can use Tylenol ibuprofen for pain.  Gargle with warm salt water for additional symptom relief.  You should follow-up with dentist; call to schedule an appointment.  If you develop any worsening symptoms including difficulty swallowing, difficulty speaking, swelling of your throat, high fever, change in your voice you  need to be seen immediately.  Use erythromycin ointment twice daily for 7 days.  Wash your hands prior to handling medication and avoid touching tip of medication bottle to the eye as this can cause contamination of the medicine.  Use lubricating eyedrops for additional symptom relief.  If your symptoms or not improving within a few days please follow-up with ophthalmology; call to schedule an appointment.  If anything worsens and you have vision change, light sensitivity, eye pain you should be seen immediately.     ED Prescriptions     Medication Sig Dispense Auth. Provider   amoxicillin-clavulanate (AUGMENTIN) 875-125 MG tablet Take 1 tablet by mouth every 12 (twelve) hours. 14 tablet Jaelani Posa K, PA-C   erythromycin ophthalmic ointment Place a 1/2 inch ribbon of ointment into the lower eyelid of left eye twice daily for 1 week 3.5 g Madeliene Tejera K, PA-C      PDMP not reviewed this encounter.   Jeani Hawking, PA-C 06/16/22 1721

## 2022-06-16 NOTE — Discharge Instructions (Signed)
Start Augmentin twice daily for 7 days.  You can use Tylenol ibuprofen for pain.  Gargle with warm salt water for additional symptom relief.  You should follow-up with dentist; call to schedule an appointment.  If you develop any worsening symptoms including difficulty swallowing, difficulty speaking, swelling of your throat, high fever, change in your voice you need to be seen immediately.  Use erythromycin ointment twice daily for 7 days.  Wash your hands prior to handling medication and avoid touching tip of medication bottle to the eye as this can cause contamination of the medicine.  Use lubricating eyedrops for additional symptom relief.  If your symptoms or not improving within a few days please follow-up with ophthalmology; call to schedule an appointment.  If anything worsens and you have vision change, light sensitivity, eye pain you should be seen immediately.

## 2022-06-16 NOTE — ED Triage Notes (Signed)
Patient reports several broken teeth. States he is scheduled to have teeth removed at the end of the month.  Pain is on right top of mouth.  Visible swelling.  Reports a history of dental issues.    Patient requesting left eye be checked out.  Says he may have been hit with a rock in left eye.  Was weed eating next to a building and thinks rock of debris ricocheted of building.    Reports this is better. Redness, sensitivity is improving.  Left eye is red.

## 2022-08-30 ENCOUNTER — Encounter (HOSPITAL_COMMUNITY): Payer: Self-pay

## 2022-08-30 ENCOUNTER — Ambulatory Visit (HOSPITAL_COMMUNITY)
Admission: EM | Admit: 2022-08-30 | Discharge: 2022-08-30 | Disposition: A | Discharge status: Home / Self Care | Payer: BLUE CROSS/BLUE SHIELD | Attending: Physician Assistant | Admitting: Physician Assistant

## 2022-08-30 DIAGNOSIS — K0889 Other specified disorders of teeth and supporting structures: Secondary | ICD-10-CM | POA: Diagnosis not present

## 2022-08-30 MED ORDER — KETOROLAC TROMETHAMINE 30 MG/ML IJ SOLN
INTRAMUSCULAR | Status: AC
  Filled 2022-08-30: qty 1

## 2022-08-30 MED ORDER — AMOXICILLIN-POT CLAVULANATE 875-125 MG PO TABS: 1.0000 | ORAL_TABLET | Freq: Two times a day (BID) | ORAL | 0 refills | Status: DC

## 2022-08-30 MED ORDER — NAPROXEN 500 MG PO TABS: 500.0000 mg | ORAL_TABLET | Freq: Two times a day (BID) | ORAL | 0 refills | Status: DC

## 2022-08-30 MED ORDER — KETOROLAC TROMETHAMINE 30 MG/ML IJ SOLN
30.0000 mg | Freq: Once | INTRAMUSCULAR | Status: AC
  Administered 2022-08-30: 30 mg via INTRAMUSCULAR

## 2022-08-30 NOTE — ED Provider Notes (Signed)
[facility]    CSN: [id number] Arrival date & time: 08/30/22  1002      History   Chief Complaint Chief Complaint  Patient presents with   Dental Problem    HPI [name] is a 52 y.o. male.   Patient presents with 2 days of dental pain.  Cracked tooth left lower molar.  Denies fever, chills, nausea, vomiting.  Reports swelling to the left side of his neck.  He reports trouble sleeping due to pain.  He has tried [facility] powder with minimal relief.  He does have a dentist and has an appointment in 1 month.    History reviewed. No pertinent past medical history.  There are no problems to display for this patient.   Past Surgical History:  Procedure Laterality Date   HAND SURGERY Left        Home Medications    Prior to Admission medications   Medication Sig Start Date End Date Taking? Authorizing Provider  amoxicillin-clavulanate (AUGMENTIN) 875-125 MG tablet Take 1 tablet by mouth every 12 (twelve) hours. 08/30/22  Yes Ward, Tylene Fantasia, PA-C  naproxen (NAPROSYN) 500 MG tablet Take 1 tablet (500 mg total) by mouth 2 (two) times daily. 08/30/22  Yes Ward, Tylene Fantasia, PA-C  erythromycin ophthalmic ointment Place a 1/2 inch ribbon of ointment into the lower eyelid of left eye twice daily for 1 week 06/16/22   Raspet, Noberto Retort, PA-C    Family History History reviewed. No pertinent family history.  Social History Social History   Tobacco Use   Smoking status: Former    Types: Cigarettes  Vaping Use   Vaping status: Never Used  Substance Use Topics   Alcohol use: Yes   Drug use: Yes    Types: Marijuana     Allergies   Patient has no known allergies.   Review of Systems Review of Systems  Constitutional:  Negative for chills and fever.  HENT:  Positive for dental problem. Negative for ear pain and sore throat.   Eyes:  Negative for pain and visual disturbance.  Respiratory:  Negative for cough and shortness of breath.   Cardiovascular:  Negative for  chest pain and palpitations.  Gastrointestinal:  Negative for abdominal pain and vomiting.  Genitourinary:  Negative for dysuria and hematuria.  Musculoskeletal:  Negative for arthralgias and back pain.  Skin:  Negative for color change and rash.  Neurological:  Negative for seizures and syncope.  All other systems reviewed and are negative.    Physical Exam Triage Vital Signs ED Triage Vitals [08/30/22 1021]  Encounter Vitals Group     BP (!) 156/88     Systolic BP Percentile      Diastolic BP Percentile      Pulse Rate (!) 54     Resp 16     Temp 97.8 F (36.6 C)     Temp Source Oral     SpO2 98 %     Weight      Height      Head Circumference      Peak Flow      Pain Score      Pain Loc      Pain Education      Exclude from Growth Chart    No data found.  Updated Vital Signs BP (!) 156/88 (BP Location: Left Arm)   Pulse (!) 54   Temp 97.8 F (36.6 C) (Oral)   Resp 16   SpO2 98%  Visual Acuity Right Eye Distance:   Left Eye Distance:   Bilateral Distance:    Right Eye Near:   Left Eye Near:    Bilateral Near:     Physical Exam Vitals and nursing note reviewed.  Constitutional:      General: He is not in acute distress.    Appearance: He is well-developed.  HENT:     Head: Normocephalic and atraumatic.     Mouth/Throat:     Dentition: Abnormal dentition.   Eyes:     Conjunctiva/sclera: Conjunctivae normal.  Cardiovascular:     Rate and Rhythm: Normal rate and regular rhythm.     Heart sounds: No murmur heard. Pulmonary:     Effort: Pulmonary effort is normal. No respiratory distress.     Breath sounds: Normal breath sounds.  Abdominal:     Palpations: Abdomen is soft.     Tenderness: There is no abdominal tenderness.  Musculoskeletal:        General: No swelling.     Cervical back: Neck supple.  Skin:    General: Skin is warm and dry.     Capillary Refill: Capillary refill takes less than 2 seconds.  Neurological:     Mental Status: He  is alert.  Psychiatric:        Mood and Affect: Mood normal.      UC Treatments / Results  Labs (all labs ordered are listed, but only abnormal results are displayed) Labs Reviewed - No data to display  EKG   Radiology No results found.  Procedures Procedures (including critical care time)  Medications Ordered in UC Medications  ketorolac (TORADOL) 30 MG/ML injection 30 mg (has no administration in time range)    Initial Impression / Assessment and Plan / UC Course  I have reviewed the triage vital signs and the nursing notes.  Pertinent labs & imaging results that were available during my care of the patient were reviewed by me and considered in my medical decision making (see chart for details).     Dental pain.  Known cracked tooth.  Toradol given in clinic today.  Augmentin prescribed.  Patient has follow-up with dentist in 1 month.  Supportive care discussed. Final Clinical Impressions(s) / UC Diagnoses   Final diagnoses:  Pain, dental     Discharge Instructions      Take antibiotic as prescribed Can take Tylenol and naproxen as needed for pain Apply cold compress  Follow up with dentist as soon as possible for further management    ED Prescriptions     Medication Sig Dispense Auth. Provider   amoxicillin-clavulanate (AUGMENTIN) 875-125 MG tablet Take 1 tablet by mouth every 12 (twelve) hours. 14 tablet Ward, Shanda Bumps Z, PA-C   naproxen (NAPROSYN) 500 MG tablet Take 1 tablet (500 mg total) by mouth 2 (two) times daily. 30 tablet Ward, Tylene Fantasia, PA-C      PDMP not reviewed this encounter.   Ward, Tylene Fantasia, PA-C 08/30/22 1039

## 2022-08-30 NOTE — ED Triage Notes (Signed)
Pt presents to the office for dental pain x 3 days.

## 2022-08-30 NOTE — Discharge Instructions (Addendum)
Take antibiotic as prescribed Can take Tylenol and naproxen as needed for pain Apply cold compress  Follow up with dentist as soon as possible for further management

## 2022-09-18 ENCOUNTER — Ambulatory Visit (HOSPITAL_COMMUNITY)
Admission: EM | Admit: 2022-09-18 | Discharge: 2022-09-18 | Disposition: A | Discharge status: Home / Self Care | Payer: BLUE CROSS/BLUE SHIELD | Attending: Family Medicine | Admitting: Family Medicine

## 2022-09-18 ENCOUNTER — Encounter (HOSPITAL_COMMUNITY): Payer: Self-pay

## 2022-09-18 DIAGNOSIS — K047 Periapical abscess without sinus: Secondary | ICD-10-CM

## 2022-09-18 MED ORDER — AMOXICILLIN-POT CLAVULANATE 875-125 MG PO TABS: 1.0000 | ORAL_TABLET | Freq: Two times a day (BID) | ORAL | 0 refills | Status: AC

## 2022-09-18 NOTE — ED Triage Notes (Signed)
Pt states dental pain for the past 2 weeks. States he was here 2 weeks ago for the same,finished the antibiotics but the pain and swelling is back. Has a dental appointment next month to have his teeth pulled.

## 2022-09-18 NOTE — ED Provider Notes (Signed)
[facility]    CSN: [id number] Arrival date & time: 09/18/22  1149      History   Chief Complaint Chief Complaint  Patient presents with   Dental Pain    HPI [name] is a 52 y.o. male.    Dental Pain Here for pain in his teeth x 2 weeks. Last night it got worse.  He was seen 8/25 and took antibiotics x 7 days. Swelling also got worse last night. No f/c/trouble breathing. Last dose of Augmentin was 6 days ago  NKDA  He does have an appt with dentist for an extraction    History reviewed. No pertinent past medical history.  There are no problems to display for this patient.   Past Surgical History:  Procedure Laterality Date   HAND SURGERY Left        Home Medications    Prior to Admission medications   Medication Sig Start Date End Date Taking? Authorizing Provider  amoxicillin-clavulanate (AUGMENTIN) 875-125 MG tablet Take 1 tablet by mouth 2 (two) times daily for 7 days. 09/18/22 09/25/22 Yes Zenia Resides, MD    Family History History reviewed. No pertinent family history.  Social History Social History   Tobacco Use   Smoking status: Former    Types: Cigarettes  Vaping Use   Vaping status: Never Used  Substance Use Topics   Alcohol use: Yes   Drug use: Yes    Types: Marijuana     Allergies   Patient has no known allergies.   Review of Systems Review of Systems   Physical Exam Triage Vital Signs ED Triage Vitals  Encounter Vitals Group     BP 09/18/22 1244 138/83     Systolic BP Percentile --      Diastolic BP Percentile --      Pulse Rate 09/18/22 1244 (!) 50     Resp 09/18/22 1244 16     Temp 09/18/22 1244 97.9 F (36.6 C)     Temp Source 09/18/22 1244 Oral     SpO2 09/18/22 1244 97 %     Weight --      Height --      Head Circumference --      Peak Flow --      Pain Score 09/18/22 1246 0     Pain Loc --      Pain Education --      Exclude from Growth Chart --    No data found.  Updated Vital  Signs BP 138/83 (BP Location: Right Arm)   Pulse (!) 50   Temp 97.9 F (36.6 C) (Oral)   Resp 16   SpO2 97%   Visual Acuity Right Eye Distance:   Left Eye Distance:   Bilateral Distance:    Right Eye Near:   Left Eye Near:    Bilateral Near:     Physical Exam Vitals reviewed.  Constitutional:      General: He is not in acute distress.    Appearance: He is not ill-appearing, toxic-appearing or diaphoretic.  HENT:     Mouth/Throat:     Mouth: Mucous membranes are moist.     Comments: There is some swelling of his left upper dental ridge and buccal mucosa.  There is no caries noted also.  And a broken tooth. Skin:    Coloration: Skin is not pale.  Neurological:     General: No focal deficit present.     Mental Status: He is alert  and oriented to person, place, and time.  Psychiatric:        Behavior: Behavior normal.      UC Treatments / Results  Labs (all labs ordered are listed, but only abnormal results are displayed) Labs Reviewed - No data to display  EKG   Radiology No results found.  Procedures Procedures (including critical care time)  Medications Ordered in UC Medications - No data to display  Initial Impression / Assessment and Plan / UC Course  I have reviewed the triage vital signs and the nursing notes.  Pertinent labs & imaging results that were available during my care of the patient were reviewed by me and considered in my medical decision making (see chart for details).        We discussed options and we decided to redo the Augmentin prescription.  He tolerated it okay except he had trouble taking in the morning because he does not usually eat in the morning.  He will take it twice a day with food and take it first in the lunchtime after his lunchtime meal and in the evening after supper.  He does have a follow-up appointment with dentistry.  He is already taking ibuprofen with good relief. Final Clinical Impressions(s) / UC Diagnoses    Final diagnoses:  Dental infection     Discharge Instructions      Take amoxicillin-clavulanate 875 mg--1 tab twice daily with food for 7 days      ED Prescriptions     Medication Sig Dispense Auth. Provider   amoxicillin-clavulanate (AUGMENTIN) 875-125 MG tablet Take 1 tablet by mouth 2 (two) times daily for 7 days. 14 tablet Ayoub Arey, Janace Aris, MD      PDMP not reviewed this encounter.   Zenia Resides, MD 09/18/22 [phone]

## 2022-09-18 NOTE — Discharge Instructions (Signed)
Take amoxicillin-clavulanate 875 mg--1 tab twice daily with food for 7 days
